# Patient Record
Sex: Female | Born: 1980 | Hispanic: Yes | Marital: Married | Smoking: Never smoker
Health system: Southern US, Community
[De-identification: ages and names within clinical notes are randomized; demographics above are authoritative.]

## PROBLEM LIST (undated history)

## (undated) DIAGNOSIS — E282 Polycystic ovarian syndrome: Secondary | ICD-10-CM

## (undated) DIAGNOSIS — E079 Disorder of thyroid, unspecified: Secondary | ICD-10-CM

## (undated) DIAGNOSIS — I10 Essential (primary) hypertension: Secondary | ICD-10-CM

## (undated) HISTORY — DX: Disorder of thyroid, unspecified: E07.9

## (undated) HISTORY — DX: Polycystic ovarian syndrome: E28.2

## (undated) HISTORY — DX: Essential (primary) hypertension: I10

---

## 2016-04-20 ENCOUNTER — Ambulatory Visit (INDEPENDENT_AMBULATORY_CARE_PROVIDER_SITE_OTHER): Payer: BLUE CROSS/BLUE SHIELD | Admitting: Obstetrics and Gynecology

## 2016-04-20 ENCOUNTER — Encounter: Payer: Self-pay | Admitting: Obstetrics and Gynecology

## 2016-04-20 VITALS — BP 167/118 | HR 88 | Ht 61.0 in | Wt 179.3 lb

## 2016-04-20 DIAGNOSIS — N938 Other specified abnormal uterine and vaginal bleeding: Secondary | ICD-10-CM

## 2016-04-20 DIAGNOSIS — E8881 Metabolic syndrome: Secondary | ICD-10-CM | POA: Diagnosis not present

## 2016-04-20 DIAGNOSIS — E282 Polycystic ovarian syndrome: Secondary | ICD-10-CM | POA: Diagnosis not present

## 2016-04-20 MED ORDER — DESOGESTREL-ETHINYL ESTRADIOL 0.15-0.02/0.01 MG (21/5) PO TABS: 1.0000 | ORAL_TABLET | Freq: Every day | ORAL | 1 refills | Status: DC

## 2016-04-20 NOTE — Progress Notes (Addendum)
HPI:      Elizabeth Potts is a 36 y.o. G0P0000 who LMP was Patient's last menstrual period was 02/25/2016 (approximate).  Subjective: She presents today to discuss her PCO. She has been previously diagnosed with this and she also has insulin resistance associated with PCO. She has been having anovulatory cycles for many years. She will go 3 months without bleeding and then have a three-week menses. She would like some resolution to this irregular bleeding. She has also experienced an increased weight gain in the last 2 years. In addition she has a concern regarding fertility. She still thinks that she may want children but is not sure of timing.    Hx: The following portions of the patient's history were reviewed and updated as appropriate:           She  has a past medical history of Hypertension and Thyroid disease. She  does not have a problem list on file. She  has no past surgical history on file. She  reports that she has never smoked. She has never used smokeless tobacco. She reports that she does not drink alcohol or use drugs.        ROS: Constitutional: Denied constitutional symptoms, night sweats, recent illness, fatigue, fever, insomnia and weight loss.  Eyes: Denied eye symptoms, eye pain, photophobia, vision change and visual disturbance.  Ears/Nose/Throat/Neck: Denied ear, nose, throat or neck symptoms, hearing loss, nasal discharge, sinus congestion and sore throat.  Cardiovascular: Denied cardiovascular symptoms, arrhythmia, chest pain/pressure, edema, exercise intolerance, orthopnea and palpitations.  Respiratory: Denied pulmonary symptoms, asthma, pleuritic pain, productive sputum, cough, dyspnea and wheezing.  Gastrointestinal: Denied, gastro-esophageal reflux, melena, nausea and vomiting.  Genitourinary: See HPI for additional information.  Musculoskeletal: Denied musculoskeletal symptoms, stiffness, swelling, muscle weakness and myalgia.  Dermatologic: Denied dermatology  symptoms, rash and scar.  Neurologic: Denied neurology symptoms, dizziness, headache, neck pain and syncope.  Psychiatric: Denied psychiatric symptoms, anxiety and depression.  Endocrine: Denied endocrine symptoms including hot flashes and night sweats.   Meds: She has a current medication list which includes the following prescription(s): amlodipine, labetalol, levothyroxine, metformin, and desogestrel-ethinyl estradiol.  Objective: Vitals:   04/20/16 0818  BP: (!) 167/118  Pulse: 88            Patient declined examination today because she is bleeding. Assessment: 1. PCOS (polycystic ovarian syndrome)   2. Insulin resistance   3. Dysfunctional uterine bleeding     She also has some fertility concerns and would like these addressed in the future. Plan:            1.  We have discussed PCO in detail. The dysfunctional bleeding and hormonal changes associated with PCO were discussed in detail. The infertility associated with PCO was also discussed.  2.  OCPs The risks /benefits of OCPs have been explained to the patient in detail.  Product literature has been given to her.  I have instructed her in the use of OCPs and have given her literature reinforcing this information.  I have explained to the patient that OCPs are not as effective for birth control during the first month of use, and that another form of contraception should be used during this time.  Both first-day start and Sunday start have been explained.  The risks and benefits of each was discussed.  She has been made aware of  the fact that other medications may affect the efficacy of OCPs.  I have answered all of her questions, and I  believe that she has an understanding of the effectiveness and use of OCPs. We have begun OCPs for treatment of PCO.  3. Continue metformin  4.  Return for annual examination in the near future to include Pap smear.  5.  After patient cycles regularly on OCPs she will inform us when she has reached  her decision regarding pregnancy.  Orders No orders of the defined types were placed in this encounter.    Meds ordered this encounter  Medications  . levothyroxine (SYNTHROID, LEVOTHROID) 100 MCG tablet    Sig: Take 100 mcg by mouth daily before breakfast.  . metFORMIN (GLUCOPHAGE) 500 MG tablet    Sig: Take 500 mg by mouth 2 (two) times daily with a meal.  . labetalol (NORMODYNE) 100 MG tablet    Sig: Take 100 mg by mouth 2 (two) times daily.  Marland Kitchen. amLODipine (NORVASC) 5 MG tablet    Sig: Take 5 mg by mouth daily.  Marland Kitchen. desogestrel-ethinyl estradiol (MIRCETTE) 0.15-0.02/0.01 MG (21/5) tablet    Sig: Take 1 tablet by mouth at bedtime.    Dispense:  3 Package    Refill:  1        F/U  Return in about 1 week (around 04/27/2016) for Annual Physical. I spent 25 minutes with this patient of which greater than 50% was spent discussing PCO both the hormonal and physical issues associated with it. We have discussed infertility. We have discussed treatments of PCO. (Please see above)  Elonda Huskyavid J. Latausha Flamm, M.D. 04/20/2016 8:59 AM

## 2016-04-20 NOTE — Progress Notes (Signed)
New 36 yo patient is here for prolonged periods. Has a history of PCOS x6 years. Is due for a pap smear- LPS 2014 with results being normal.

## 2016-05-04 ENCOUNTER — Encounter: Payer: Self-pay | Admitting: Obstetrics and Gynecology

## 2016-05-04 ENCOUNTER — Ambulatory Visit (INDEPENDENT_AMBULATORY_CARE_PROVIDER_SITE_OTHER): Payer: BLUE CROSS/BLUE SHIELD | Admitting: Obstetrics and Gynecology

## 2016-05-04 VITALS — BP 165/94 | HR 86 | Wt 181.2 lb

## 2016-05-04 DIAGNOSIS — E282 Polycystic ovarian syndrome: Secondary | ICD-10-CM | POA: Diagnosis not present

## 2016-05-04 DIAGNOSIS — E8881 Metabolic syndrome: Secondary | ICD-10-CM | POA: Diagnosis not present

## 2016-05-04 DIAGNOSIS — E039 Hypothyroidism, unspecified: Secondary | ICD-10-CM | POA: Diagnosis not present

## 2016-05-04 DIAGNOSIS — Z0000 Encounter for general adult medical examination without abnormal findings: Secondary | ICD-10-CM | POA: Diagnosis not present

## 2016-05-04 DIAGNOSIS — I10 Essential (primary) hypertension: Secondary | ICD-10-CM

## 2016-05-04 MED ORDER — DESOGESTREL-ETHINYL ESTRADIOL 0.15-0.02/0.01 MG (21/5) PO TABS: 1.0000 | ORAL_TABLET | Freq: Every day | ORAL | 3 refills | Status: DC

## 2016-05-04 NOTE — Progress Notes (Signed)
HPI:      Elizabeth Potts is a 36 y.o. G0P0000 who LMP was Patient's last menstrual period was 02/27/2016 (exact date).  Subjective: She presents today for her annual examination.  She has begun OCPs for PCO but she has not finished the pack yet. She has not yet decided on attempting fertility.    Hx: The following portions of the patient's history were reviewed and updated as appropriate:            She  has a past medical history of Hypertension and Thyroid disease. She  does not have any pertinent problems on file. She  has no past surgical history on file. Her family history includes Cancer in her father and mother; Diabetes in her father and mother; Hypertension in her father and mother. She  reports that she has never smoked. She has never used smokeless tobacco. She reports that she does not drink alcohol or use drugs. Current Outpatient Prescriptions on File Prior to Visit  Medication Sig Dispense Refill  . amLODipine (NORVASC) 5 MG tablet Take 5 mg by mouth daily.    Marland Kitchen. labetalol (NORMODYNE) 100 MG tablet Take 100 mg by mouth 2 (two) times daily.    Marland Kitchen. levothyroxine (SYNTHROID, LEVOTHROID) 100 MCG tablet Take 100 mcg by mouth daily before breakfast.    . metFORMIN (GLUCOPHAGE) 500 MG tablet Take 500 mg by mouth 2 (two) times daily with a meal.     No current facility-administered medications on file prior to visit.           ROS: Constitutional: Denied constitutional symptoms, night sweats, recent illness, fatigue, fever, insomnia and weight loss.  Eyes: Denied eye symptoms, eye pain, photophobia, vision change and visual disturbance.  Ears/Nose/Throat/Neck: Denied ear, nose, throat or neck symptoms, hearing loss, nasal discharge, sinus congestion and sore throat.  Cardiovascular: Denied cardiovascular symptoms, arrhythmia, chest pain/pressure, edema, exercise intolerance, orthopnea and palpitations.  Respiratory: Denied pulmonary symptoms, asthma, pleuritic pain, productive sputum,  cough, dyspnea and wheezing.  Gastrointestinal: Denied, gastro-esophageal reflux, melena, nausea and vomiting.  Genitourinary: Denied genitourinary symptoms including symptomatic vaginal discharge, pelvic relaxation issues, and urinary complaints.  Musculoskeletal: Denied musculoskeletal symptoms, stiffness, swelling, muscle weakness and myalgia.  Dermatologic: Denied dermatology symptoms, rash and scar.  Neurologic: Denied neurology symptoms, dizziness, headache, neck pain and syncope.  Psychiatric: Denied psychiatric symptoms, anxiety and depression.  Endocrine: Denied endocrine symptoms including hot flashes and night sweats.   Meds: She has a current medication list which includes the following prescription(s): amlodipine, desogestrel-ethinyl estradiol, labetalol, levothyroxine, and metformin.  Objective: Vitals:   05/04/16 1003  BP: (!) 165/94  Pulse: 86            Physical examination General NAD, Conversant  HEENT Atraumatic; Op clear with mmm.  Normo-cephalic. Pupils reactive. Anicteric sclerae  Thyroid/Neck Smooth without nodularity or enlargement. Normal ROM.  Neck Supple.  Skin No rashes, lesions or ulceration. Normal palpated skin turgor. No nodularity.  Breasts: No masses or discharge.  Symmetric.  No axillary adenopathy.  Lungs: Clear to auscultation.No rales or wheezes. Normal Respiratory effort, no retractions.  Heart: NSR.  No murmurs or rubs appreciated. No periferal edema  Abdomen: Soft.  Non-tender.  No masses.  No HSM. No hernia  Extremities: Moves all appropriately.  Normal ROM for age. No lymphadenopathy.  Neuro: Oriented to PPT.  Normal mood. Normal affect.     Pelvic:   Vulva: Normal appearance.  No lesions.  Vagina: No lesions or abnormalities noted.  Support:  Normal pelvic support.  Urethra No masses tenderness or scarring.  Meatus Normal size without lesions or prolapse.  Cervix: Normal appearance.  No lesions.  Anus: Normal exam.  No lesions.   Perineum: Normal exam.  No lesions.        Bimanual   Uterus: Normal size.  Non-tender.  Mobile.  AV.  Adnexae: No masses.  Non-tender to palpation.  Cul-de-sac: Negative for abnormality.     Assessment: 1. Encounter for annual physical exam   2. PCO (polycystic ovaries)   3. Acquired hypothyroidism   4. Insulin resistance   5. PCOS (polycystic ovarian syndrome)   6. Essential hypertension       Plan:            1.  Basic Screening Recommendations The basic screening recommendations for asymptomatic women were discussed with the patient during her visit.  The age-appropriate recommendations were discussed with her and the rational for the tests reviewed.  When I am informed by the patient that another primary care physician has previously obtained the age-appropriate tests and they are up-to-date, only outstanding tests are ordered and referrals given as necessary.  Abnormal results of tests will be discussed with her when all of her results are completed. Pap performed   2.  She will contact us if she changes her mind regarding immediate fertility.  3.  Continue OCPs   Meds ordered this encounter  Medications  . desogestrel-ethinyl estradiol (MIRCETTE) 0.15-0.02/0.01 MG (21/5) tablet    Sig: Take 1 tablet by mouth at bedtime.    Dispense:  3 Package    Refill:  3        F/U  Return in about 1 year (around 05/04/2017) for Annual Physical, We will contact her with any abnormal test results.  Elonda Husky, M.D. 05/04/2016 11:04 AM

## 2016-05-06 LAB — PAP IG AND HPV HIGH-RISK
HPV, high-risk: NEGATIVE
PAP Smear Comment: 0

## 2016-05-07 ENCOUNTER — Telehealth: Payer: Self-pay

## 2016-05-07 NOTE — Telephone Encounter (Signed)
Patient informed of negative test results per provider 

## 2016-05-07 NOTE — Telephone Encounter (Signed)
-----   Message from Linzie Collinavid James Evans, MD sent at 05/07/2016 10:56 AM EST ----- Negative Pap and HPV

## 2016-09-01 ENCOUNTER — Other Ambulatory Visit: Payer: Self-pay | Admitting: Family Medicine

## 2016-09-01 DIAGNOSIS — R1011 Right upper quadrant pain: Secondary | ICD-10-CM

## 2016-10-19 ENCOUNTER — Ambulatory Visit
Admission: RE | Admit: 2016-10-19 | Discharge: 2016-10-19 | Disposition: A | Discharge status: Home / Self Care | Payer: BLUE CROSS/BLUE SHIELD | Source: Ambulatory Visit | Attending: Family Medicine | Admitting: Family Medicine

## 2016-10-19 DIAGNOSIS — R1011 Right upper quadrant pain: Secondary | ICD-10-CM

## 2016-10-19 DIAGNOSIS — R932 Abnormal findings on diagnostic imaging of liver and biliary tract: Secondary | ICD-10-CM | POA: Insufficient documentation

## 2017-01-13 ENCOUNTER — Encounter: Payer: Self-pay | Admitting: Emergency Medicine

## 2017-01-13 ENCOUNTER — Emergency Department
Admission: EM | Admit: 2017-01-13 | Discharge: 2017-01-13 | Disposition: A | Discharge status: Home / Self Care | Payer: BLUE CROSS/BLUE SHIELD | Attending: Emergency Medicine | Admitting: Emergency Medicine

## 2017-01-13 DIAGNOSIS — R748 Abnormal levels of other serum enzymes: Secondary | ICD-10-CM | POA: Insufficient documentation

## 2017-01-13 DIAGNOSIS — R809 Proteinuria, unspecified: Secondary | ICD-10-CM | POA: Diagnosis not present

## 2017-01-13 DIAGNOSIS — B349 Viral infection, unspecified: Secondary | ICD-10-CM | POA: Diagnosis not present

## 2017-01-13 DIAGNOSIS — I10 Essential (primary) hypertension: Secondary | ICD-10-CM | POA: Insufficient documentation

## 2017-01-13 DIAGNOSIS — Z79899 Other long term (current) drug therapy: Secondary | ICD-10-CM | POA: Insufficient documentation

## 2017-01-13 DIAGNOSIS — Z7984 Long term (current) use of oral hypoglycemic drugs: Secondary | ICD-10-CM | POA: Diagnosis not present

## 2017-01-13 DIAGNOSIS — M791 Myalgia, unspecified site: Secondary | ICD-10-CM | POA: Diagnosis present

## 2017-01-13 LAB — URINALYSIS, COMPLETE (UACMP) WITH MICROSCOPIC
BILIRUBIN URINE: NEGATIVE
Glucose, UA: NEGATIVE mg/dL
KETONES UR: NEGATIVE mg/dL
LEUKOCYTES UA: NEGATIVE
Nitrite: NEGATIVE
Specific Gravity, Urine: 1.026 (ref 1.005–1.030)
pH: 5 (ref 5.0–8.0)

## 2017-01-13 LAB — COMPREHENSIVE METABOLIC PANEL
ALT: 102 U/L — AB (ref 14–54)
AST: 275 U/L — AB (ref 15–41)
Albumin: 3.5 g/dL (ref 3.5–5.0)
Alkaline Phosphatase: 60 U/L (ref 38–126)
Anion gap: 12 (ref 5–15)
BUN: 13 mg/dL (ref 6–20)
CHLORIDE: 102 mmol/L (ref 101–111)
CO2: 21 mmol/L — AB (ref 22–32)
CREATININE: 1.14 mg/dL — AB (ref 0.44–1.00)
Calcium: 8.7 mg/dL — ABNORMAL LOW (ref 8.9–10.3)
GFR calc Af Amer: >60 mL/min (ref >60)
GFR calc non Af Amer: >60 mL/min (ref >60)
Glucose, Bld: 120 mg/dL — ABNORMAL HIGH (ref 65–99)
Potassium: 3.6 mmol/L (ref 3.5–5.1)
SODIUM: 135 mmol/L (ref 135–145)
Total Bilirubin: 0.4 mg/dL (ref 0.3–1.2)
Total Protein: 7.3 g/dL (ref 6.5–8.1)

## 2017-01-13 LAB — CBC
HCT: 41.3 % (ref 35.0–47.0)
Hemoglobin: 14 g/dL (ref 12.0–16.0)
MCH: 26.7 pg (ref 26.0–34.0)
MCHC: 33.9 g/dL (ref 32.0–36.0)
MCV: 78.8 fL — ABNORMAL LOW (ref 80.0–100.0)
PLATELETS: 246 10*3/uL (ref 150–440)
RBC: 5.24 MIL/uL — AB (ref 3.80–5.20)
RDW: 14.6 % — ABNORMAL HIGH (ref 11.5–14.5)
WBC: 7.6 10*3/uL (ref 3.6–11.0)

## 2017-01-13 LAB — POCT PREGNANCY, URINE: Preg Test, Ur: NEGATIVE

## 2017-01-13 LAB — LIPASE, BLOOD: LIPASE: 25 U/L (ref 11–51)

## 2017-01-13 LAB — TSH: TSH: 4.681 u[IU]/mL — ABNORMAL HIGH (ref 0.350–4.500)

## 2017-01-13 MED ORDER — CYCLOBENZAPRINE HCL 5 MG PO TABS: 5.0000 mg | ORAL_TABLET | Freq: Three times a day (TID) | ORAL | 0 refills | Status: AC | PRN

## 2017-01-13 MED ORDER — SODIUM CHLORIDE 0.9 % IV BOLUS (SEPSIS)
1000.0000 mL | Freq: Once | INTRAVENOUS | Status: AC
  Administered 2017-01-13: 1000 mL via INTRAVENOUS

## 2017-01-13 NOTE — ED Triage Notes (Signed)
Pt reports generalized body aches for four days. Pt states vomited once two days ago but denies any vomiting since. Pt denies diarrhea, denies cough or nasal congestion. Pt ambulatory to triage. No apparent distress noted. Pt reports took BP medication twenty minutes ago.

## 2017-01-13 NOTE — ED Provider Notes (Signed)
[facility] Emergency Department Provider Note  ____________________________________________  Time seen: Approximately 10:59 AM  I have reviewed the triage vital signs and the nursing notes.   HISTORY  Chief Complaint Generalized body aches    HPI Tawanna SatDary Sondra ComeCruz is a 36 y.o. female that presents to the emergency department for body aches for 2 days. She vomited once yesterday. Pain started in her shoulders and arms. Pain today is primarily in her shoulders and in her legs. She has been taking Advil for body aches. No Tylenol use.  Patient does not drink alcohol.  Partner was sick earlier this week with similar symptoms. Her primary care provider increased her Synthroid this week. She was taking 150 mg and is now taking 175 mg. Patient is from [place] and moved here 4 days ago. She believes she received all her vaccinations as a child. Her menstrual cycle was last week but she missed a dose of her birth control and had some spotting. She had an ultrasound completed 2 months ago for abdominal cramping at that time. She denies fever, headache, visual changes, SOB, CP, nausea, abdominal pain, back pain, hematuria, dysuria, urgency, frequency.    Past Medical History:  Diagnosis Date  . Hypertension   . Thyroid disease     Patient Active Problem List   Diagnosis Date Noted  . PCO (polycystic ovaries) 05/04/2016  . Acquired hypothyroidism 05/04/2016  . Insulin resistance 05/04/2016  . Essential hypertension 05/04/2016    No past surgical history on file.  Prior to Admission medications   Medication Sig Start Date End Date Taking? Authorizing Provider  amLODipine (NORVASC) 5 MG tablet Take 5 mg by mouth daily.    [provider]  cyclobenzaprine (FLEXERIL) 5 MG tablet Take 1 tablet (5 mg total) by mouth 3 (three) times daily as needed for muscle spasms. 01/13/17 01/20/17  Enid DerryWagner, Tashunda Vandezande, PA-C  desogestrel-ethinyl estradiol (MIRCETTE) 0.15-0.02/0.01 MG (21/5)  tablet Take 1 tablet by mouth at bedtime. 05/04/16   Linzie CollinEvans, David James, MD  labetalol (NORMODYNE) 100 MG tablet Take 100 mg by mouth 2 (two) times daily.    [provider]  levothyroxine (SYNTHROID, LEVOTHROID) 100 MCG tablet Take 100 mcg by mouth daily before breakfast.    [provider]  metFORMIN (GLUCOPHAGE) 500 MG tablet Take 500 mg by mouth 2 (two) times daily with a meal.    [provider]    Allergies Patient has no known allergies.  Family History  Problem Relation Age of Onset  . Cancer Mother   . Diabetes Mother   . Hypertension Mother   . Cancer Father   . Diabetes Father   . Hypertension Father     Social History Social History  Substance Use Topics  . Smoking status: Never Smoker  . Smokeless tobacco: Never Used  . Alcohol use No     Review of Systems  Constitutional: No fever/chills Cardiovascular: No chest pain. Respiratory: No SOB. Gastrointestinal: No abdominal pain.  No nausea.  Genitourinary: Negative for dysuria. Skin: Negative for rash, abrasions, lacerations, ecchymosis. Neurological: Negative for headaches, numbness or tingling   ____________________________________________   PHYSICAL EXAM:  VITAL SIGNS: ED Triage Vitals  Enc Vitals Group     BP 01/13/17 1025 (!) 169/108     Pulse Rate 01/13/17 1025 (!) 118     Resp 01/13/17 1025 20     Temp 01/13/17 1025 99.5 F (37.5 C)     Temp Source 01/13/17 1025 Oral     SpO2  01/13/17 1025 98 %     Weight 01/13/17 1026 170 lb (77.1 kg)     Height 01/13/17 1026 5\' 1"  (1.549 m)     Head Circumference --      Peak Flow --      Pain Score 01/13/17 1025 6     Pain Loc --      Pain Edu? --      Excl. in GC? --      Constitutional: Alert and oriented. Well appearing and in no acute distress. Eyes: Conjunctivae are normal. PERRL. EOMI. Head: Atraumatic. ENT:      Ears:      Nose: No congestion/rhinnorhea.      Mouth/Throat: Mucous membranes are moist.  Neck: No  stridor.   Cardiovascular: Normal rate, regular rhythm.  Good peripheral circulation. Respiratory: Normal respiratory effort without tachypnea or retractions. Lungs CTAB. Good air entry to the bases with no decreased or absent breath sounds. Gastrointestinal: Bowel sounds 4 quadrants. Soft and nontender to palpation. No guarding or rigidity. No palpable masses. No distention. No CVA tenderness. Musculoskeletal: Full range of motion to all extremities. No gross deformities appreciated. Neurologic:  Normal speech and language. No gross focal neurologic deficits are appreciated.  Skin:  Skin is warm, dry and intact. No rash noted. Psychiatric: Mood and affect are normal. Speech and behavior are normal. Patient exhibits appropriate insight and judgement.   ____________________________________________   LABS (all labs ordered are listed, but only abnormal results are displayed)  Labs Reviewed  URINALYSIS, COMPLETE (UACMP) WITH MICROSCOPIC - Abnormal; Notable for the following:       Result Value   Color, Urine YELLOW (*)    APPearance HAZY (*)    Hgb urine dipstick LARGE (*)    Protein, ur >=300 (*)    Bacteria, UA RARE (*)    Squamous Epithelial / LPF 0-5 (*)    All other components within normal limits  CBC - Abnormal; Notable for the following:    RBC 5.24 (*)    MCV 78.8 (*)    RDW 14.6 (*)    All other components within normal limits  COMPREHENSIVE METABOLIC PANEL - Abnormal; Notable for the following:    CO2 21 (*)    Glucose, Bld 120 (*)    Creatinine, Ser 1.14 (*)    Calcium 8.7 (*)    AST 275 (*)    ALT 102 (*)    All other components within normal limits  TSH - Abnormal; Notable for the following:    TSH 4.681 (*)    All other components within normal limits  URINE CULTURE  LIPASE, BLOOD  HEPATITIS PANEL, ACUTE  HEPATITIS B VIRUS (PROFILE VI)  HEPATITIS A ANTIBODY, TOTAL  POC URINE PREG, ED  POCT PREGNANCY, URINE    ____________________________________________  EKG   ____________________________________________  RADIOLOGY   No results found.  ____________________________________________    PROCEDURES  Procedure(s) performed:    Procedures    Medications  sodium chloride 0.9 % bolus 1,000 mL (0 mLs Intravenous Stopped 01/13/17 1212)     ____________________________________________   INITIAL IMPRESSION / ASSESSMENT AND PLAN / ED COURSE  Pertinent labs & imaging results that were available during my care of the patient were reviewed by me and considered in my medical decision making (see chart for details).  Review of the [place] CSRS was performed in accordance of the [facility] prior to dispensing any controlled drugs.     Patient presented to the emergency room for evaluation  of muscle aches for 2 days. Vital signs and exam are reassuring. Patient appears well. This is likely viral, as partner has similar symptoms. Blood work was drawn to evaluate electrolytes and WBC. CMP remarkable for AST of 275 and ALT of 102. Patient had an ultrasound recently, which showed a fatty liver. Hepatitis panel is in progress. Urinalysis shows proteinuria. She has both high blood pressure and diabetes. She has not seen any blood in her urine. She took her BP medication upon arriving to the ED. She is not having any nausea, vomiting, abdominal pain. We discussed imaging but that these findings are likely chronic and not related to her muscle aches. She has good follow up with primary care and agreed to call PCP in the morning.  Patient will be discharged home with prescriptions for flexeril. Patient is to follow up with PCP for repeat blood work, urinalysis, and to discuss blood pressure medication. Patient is given ED precautions to return to the ED for any worsening or new symptoms.     ____________________________________________  FINAL CLINICAL IMPRESSION(S) / ED DIAGNOSES  Final diagnoses:   Proteinuria, unspecified type  Elevated liver enzymes  Viral illness      NEW MEDICATIONS STARTED DURING THIS VISIT:  Discharge Medication List as of 01/13/2017  2:43 PM    START taking these medications   Details  cyclobenzaprine (FLEXERIL) 5 MG tablet Take 1 tablet (5 mg total) by mouth 3 (three) times daily as needed for muscle spasms., Starting Thu 01/13/2017, Until Thu 01/20/2017, Print            This chart was dictated using voice recognition software/Dragon. Despite best efforts to proofread, errors can occur which can change the meaning. Any change was purely unintentional.    Enid Derry, PA-C 01/13/17 1851    Phineas Semen, MD 01/14/17 1525

## 2017-01-13 NOTE — ED Notes (Addendum)
Pt c/o generalized body aches for 2 days. Pt states she vomited once yesterday as well. Pt states she has sinus pain. Pt mostly c/o pain in shoulders, lower back and legs. Pt states she has been eating and drinking normally. Pt states she took ibuprofen and excedrin yesterday.

## 2017-01-13 NOTE — Discharge Instructions (Addendum)
Please follow up with primary care as soon as possible for repeat blood work and urinalysis. Hepatitis panel is still in process.

## 2017-01-14 LAB — HEPATITIS PANEL, ACUTE
HCV Ab: <0.1 s/co ratio (ref 0.0–0.9)
Hep A IgM: NEGATIVE
Hep B C IgM: NEGATIVE
Hepatitis B Surface Ag: NEGATIVE

## 2017-01-14 LAB — HEPATITIS A ANTIBODY, TOTAL: Hep A Total Ab: NEGATIVE

## 2017-01-14 LAB — HEPATITIS B VIRUS (PROFILE VI)
HEP B C TOTAL AB: NEGATIVE
HEP B E AB: NEGATIVE
HEP B E AG: NEGATIVE
HEP B S AB: NONREACTIVE
HEP B S AG: NEGATIVE
Hep B C IgM: NEGATIVE

## 2017-01-15 LAB — URINE CULTURE

## 2018-07-09 IMAGING — US US ABDOMEN COMPLETE
1 series · 13 of 25 positions shown · non-contrast
Comparison: None.

CLINICAL DATA: Right upper quadrant abdominal pain, worse with
food, intermittent nausea and vomiting over the last 2 months

EXAM:
ABDOMEN ULTRASOUND COMPLETE

[Series 1: us abdomen complete · 0.25mm/px · 13 of 108 slices shown]
[im 1/108]
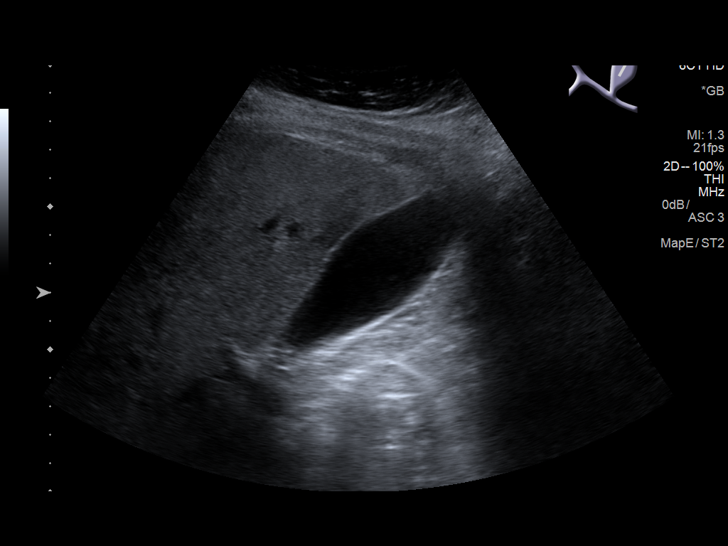
[im 9/108]
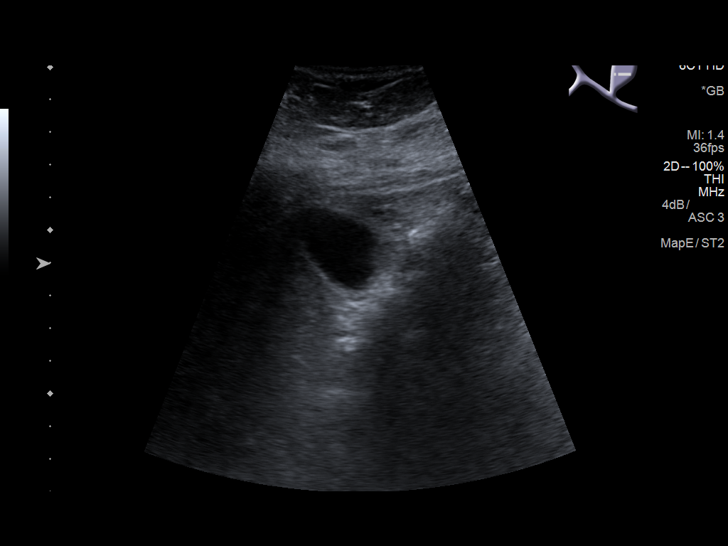
[im 18/108]
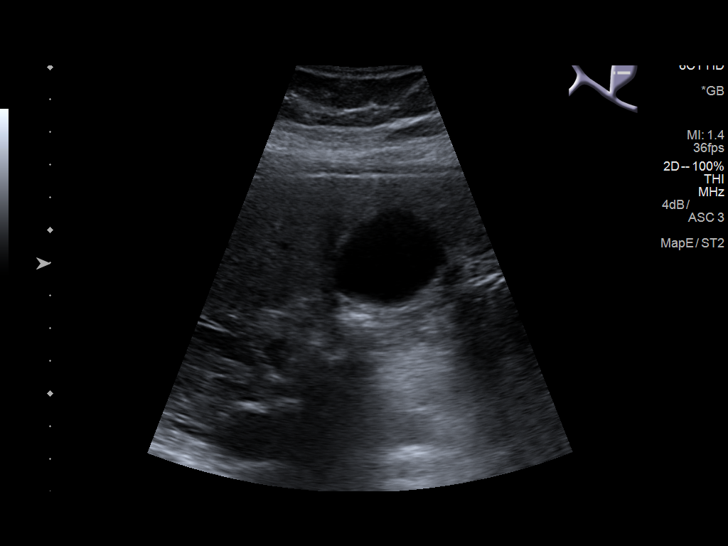
[im 27/108]
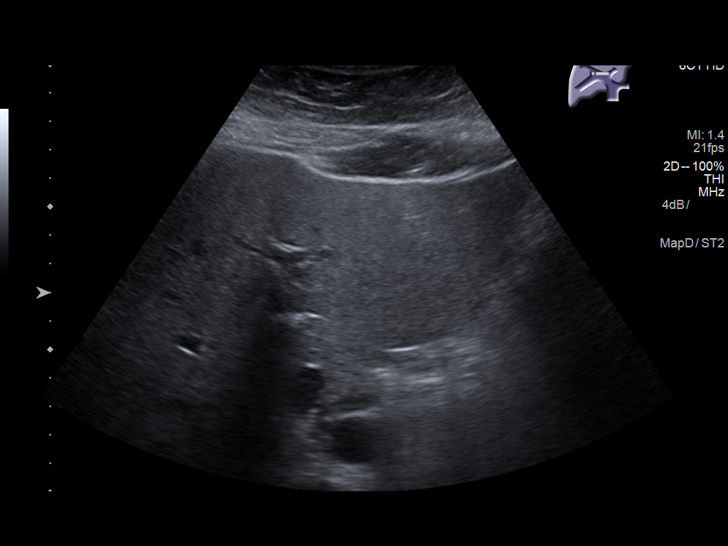
[im 36/108]
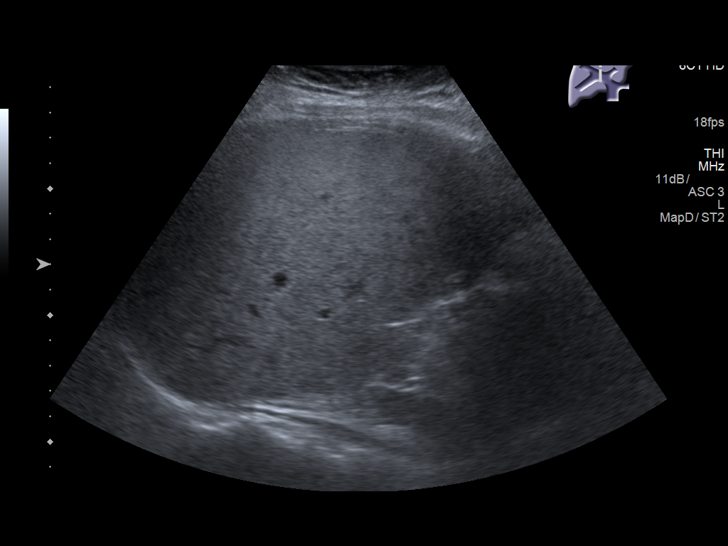
[im 45/108]
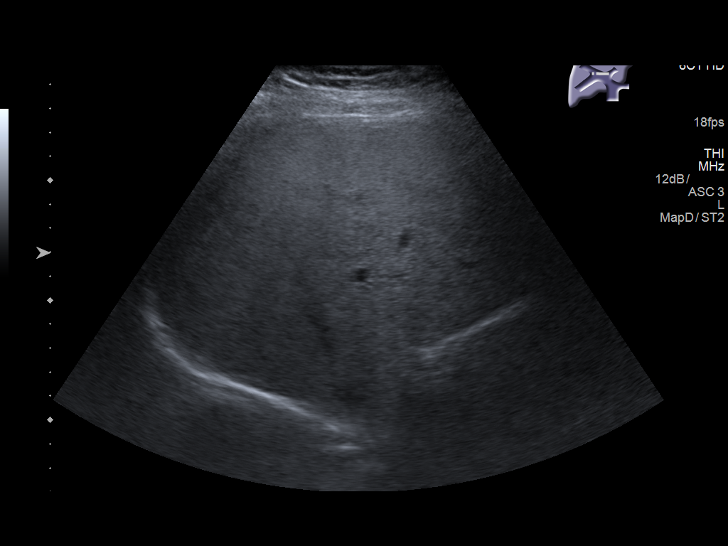
[im 54/108]
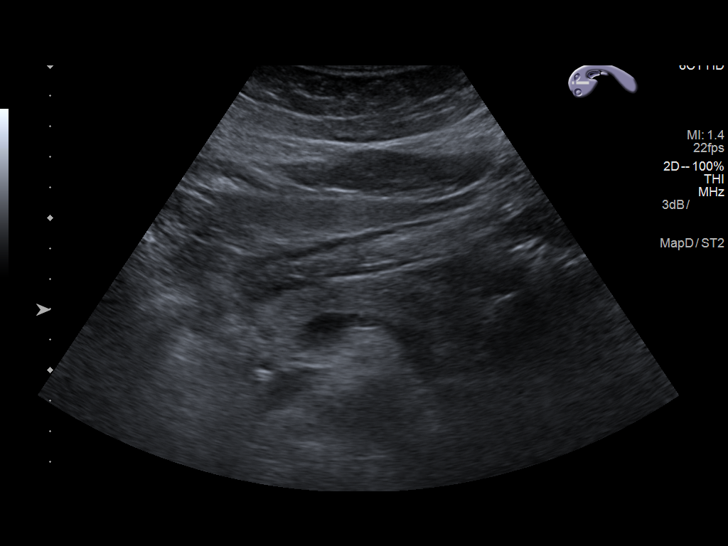
[im 63/108]
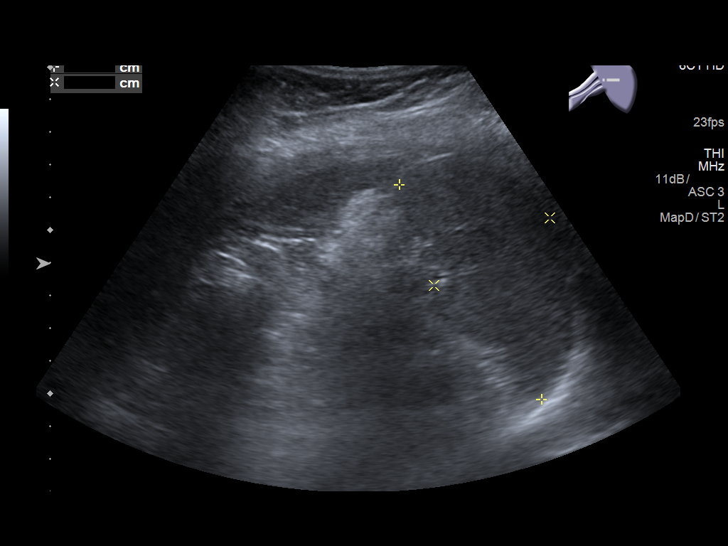
[im 72/108]
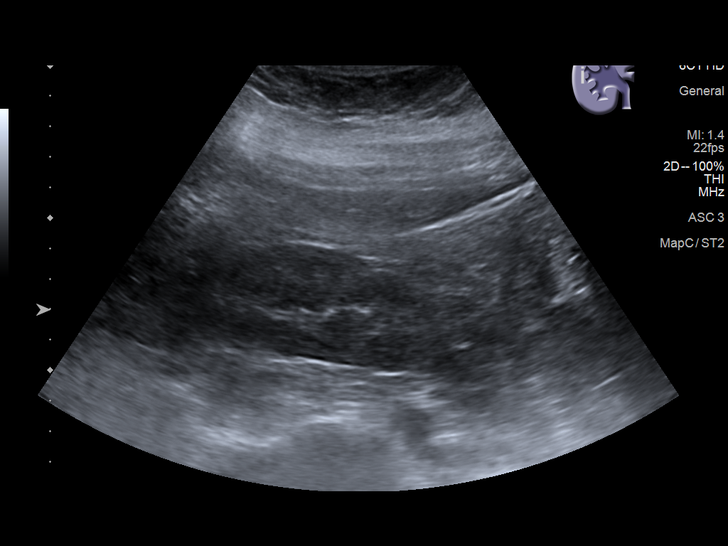
[im 81/108]
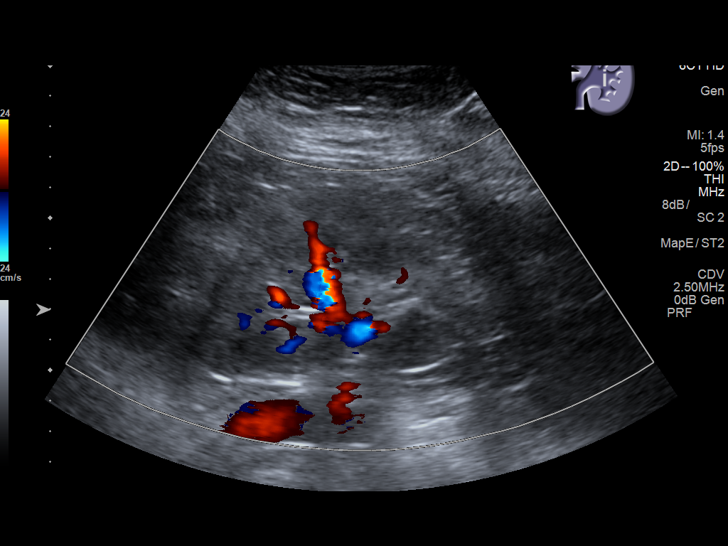
[im 90/108]
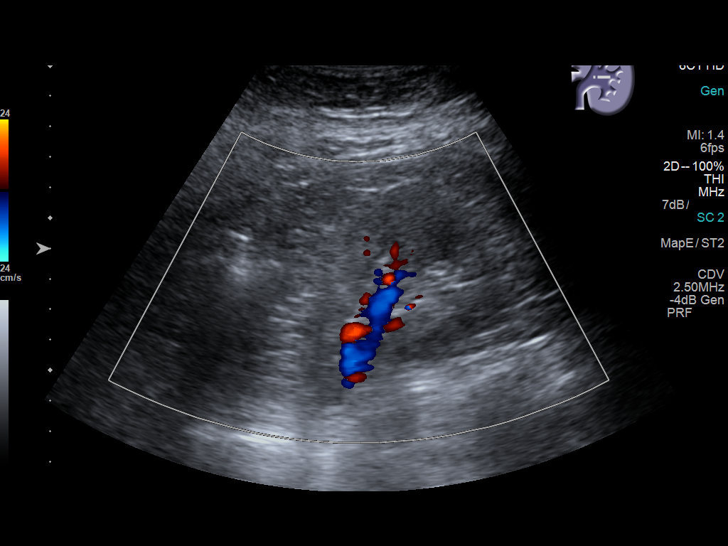
[im 99/108]
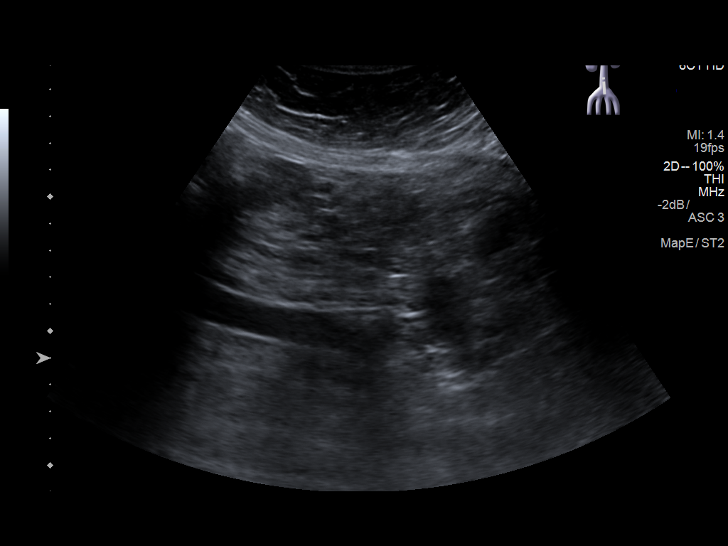
[im 108/108]
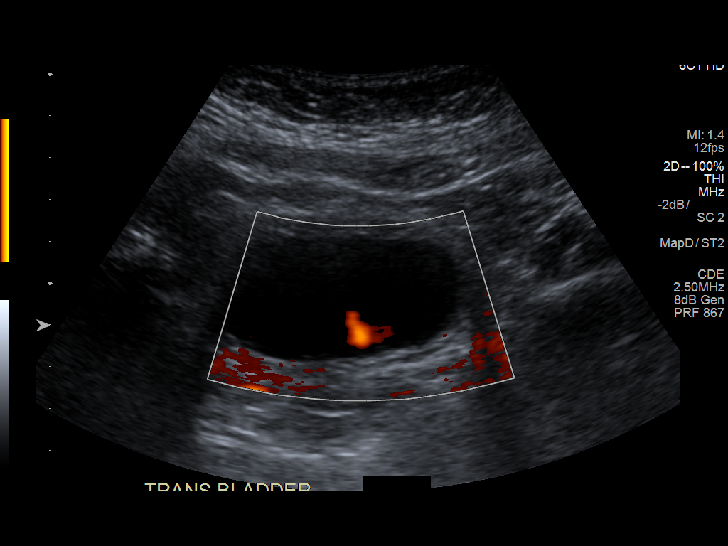

[13 of 25 positions shown; findings below may reference images not displayed]

FINDINGS: Gallbladder: The gallbladder is visualized and no gallstones are
noted. There is a small focus of echogenicity within the anterior
gallbladder wall with comet tail artifact, most consistent with a
focus of adenomyomatosis. There is no pain over the gallbladder with
compression.

Common bile duct: Diameter: The common bile duct is normal measuring
3.4 mm in diameter.

Liver: The liver parenchyma is echogenic and inhomogeneous
consistent with diffuse fatty infiltration. No focal hepatic
abnormality is seen.

IVC: No abnormality visualized.

Pancreas: The head and body the pancreas is relatively well
visualized. Only the most distal tail is obscured by bowel gas.

Spleen: The spleen measures 5.2 cm.

Right Kidney: Length: 9.8 cm..  No hydronephrosis is seen.

Left Kidney: Length: 10.6 cm.. There is minimal fullness of the left
pelvocaliceal system of questionable significance. No echogenic
calculi are evident. The echogenicity of the left kidney may be
somewhat increased.

Abdominal aorta: The abdominal aorta is normal in caliber.

Other findings: None.
IMPRESSION: 1. No gallstones, but there does appear to be a focus of
adenomyomatosis within the gallbladder wall. No ductal dilatation.
2. Echogenic liver parenchyma consistent with fatty infiltration. No
focal hepatic abnormality.
3. Mild fullness of the left pelvocaliceal system of questionable
significance. Also the left renal parenchyma may be slightly
echogenic.

## 2018-12-05 ENCOUNTER — Ambulatory Visit: Payer: BLUE CROSS/BLUE SHIELD | Admitting: General Surgery

## 2018-12-21 ENCOUNTER — Encounter: Payer: Self-pay | Admitting: General Surgery

## 2018-12-21 ENCOUNTER — Other Ambulatory Visit: Payer: Self-pay

## 2018-12-21 ENCOUNTER — Ambulatory Visit (INDEPENDENT_AMBULATORY_CARE_PROVIDER_SITE_OTHER): Payer: BLUE CROSS/BLUE SHIELD | Admitting: General Surgery

## 2018-12-21 VITALS — BP 154/105 | HR 78 | Temp 97.7°F | Resp 12 | Ht 60.0 in | Wt 182.0 lb

## 2018-12-21 DIAGNOSIS — R1011 Right upper quadrant pain: Secondary | ICD-10-CM | POA: Diagnosis not present

## 2018-12-21 NOTE — Patient Instructions (Addendum)
We will schedule you for an Ultrasound of the Gallbladder.   This is scheduled at the [facility] on Thursday October 15th at 10:15 am. Elizabeth Potts will need to arrive there by 10:00 am. You will need to have nothing eat or drink after midnight the night prior.  We will call you once we get these results.    Gallbladder Eating Plan If you have a gallbladder condition, you may have trouble digesting fats. Eating a low-fat diet can help reduce your symptoms, and may be helpful before and after having surgery to remove your gallbladder (cholecystectomy). Your health care provider may recommend that you work with a diet and nutrition specialist (dietitian) to help you reduce the amount of fat in your diet. What are tips for following this plan? General guidelines  Limit your fat intake to less than 30% of your total daily calories. If you eat around 1,800 calories each day, this is less than 60 grams (g) of fat per day.  Fat is an important part of a healthy diet. Eating a low-fat diet can make it hard to maintain a healthy body weight. Ask your dietitian how much fat, calories, and other nutrients you need each day.  Eat small, frequent meals throughout the day instead of three large meals.  Drink at least 8-10 cups of fluid a day. Drink enough fluid to keep your urine clear or pale yellow.  Limit alcohol intake to no more than 1 drink a day for nonpregnant women and 2 drinks a day for men. One drink equals 12 oz of beer, 5 oz of wine, or 1 oz of hard liquor. Reading food labels  Check Nutrition Facts on food labels for the amount of fat per serving. Choose foods with less than 3 grams of fat per serving. Shopping  Choose nonfat and low-fat healthy foods. Look for the words "nonfat," "low fat," or "fat free."  Avoid buying processed or prepackaged foods. Cooking  Cook using low-fat methods, such as baking, broiling, grilling, or boiling.  Cook with small amounts of  healthy fats, such as olive oil, grapeseed oil, canola oil, or sunflower oil. What foods are recommended?   All fresh, frozen, or canned fruits and vegetables.  Whole grains.  Low-fat or non-fat (skim) milk and yogurt.  Lean meat, skinless poultry, fish, eggs, and beans.  Low-fat protein supplement powders or drinks.  Spices and herbs. What foods are not recommended?  High-fat foods. These include baked goods, fast food, fatty cuts of meat, ice cream, french toast, sweet rolls, pizza, cheese bread, foods covered with butter, creamy sauces, or cheese.  Fried foods. These include french fries, tempura, battered fish, breaded chicken, fried breads, and sweets.  Foods with strong odors.  Foods that cause bloating and gas. Summary  A low-fat diet can be helpful if you have a gallbladder condition, or before and after gallbladder surgery.  Limit your fat intake to less than 30% of your total daily calories. This is about 60 g of fat if you eat 1,800 calories each day.  Eat small, frequent meals throughout the day instead of three large meals. This information is not intended to replace advice given to you by your health care provider. Make sure you discuss any questions you have with your health care provider. Document Released: 03/06/2013 Document Revised: 06/22/2018 Document Reviewed: 04/08/2016 Elsevier Patient Education  2020 [employer].

## 2018-12-21 NOTE — Progress Notes (Signed)
Patient ID: Elizabeth Potts, female   DOB: 19-Jun-1980, 38 y.o.   MRN: [id number]  Chief Complaint  Patient presents with  . New Patient (Initial Visit)    gallbladder    HPI Elizabeth Potts is a 38 y.o. female.  * She was referred by her primary care provider for further evaluation of right upper quadrant pain.  [name] states that starting in 2018, she had intermittent right upper quadrant pain.  This was evaluated with an ultrasound.  Adenomyomatosis was appreciated within the gallbladder, but no stones, sludge, gallbladder wall thickening, pericholecystic fluid, or biliary dilatation was identified.  In November 2019, she reports that she began having more frequent episodes, and that the pain was worse when she had them.  She states that the pain would wrap around from her right upper quadrant to her back.  Currently, the pain still occurs on a fairly regular basis.  It is worse when eating fatty foods.  She does endorse some nausea in the mornings when she wakes up, but has not experienced any vomiting.  She endorses constipation.  She has never had acholic stools.  She has not had any fevers or chills.  She denies jaundice or any history of pancreatitis.  She would like to avoid surgery if possible.   Past Medical History:  Diagnosis Date  . Hypertension   . PCOS (polycystic ovarian syndrome)   . Thyroid disease     History reviewed. No pertinent surgical history.  Family History  Problem Relation Age of Onset  . Cancer Mother   . Diabetes Mother   . Hypertension Mother   . Cancer Father   . Diabetes Father   . Hypertension Father   . Thyroid disease Father     Social History Social History   Tobacco Use  . Smoking status: Never Smoker  . Smokeless tobacco: Never Used  Substance Use Topics  . Alcohol use: No  . Drug use: No    No Known Allergies  Current Outpatient Medications  Medication Sig Dispense Refill  . amLODipine (NORVASC) 5 MG tablet Take 5 mg by mouth daily.    Marland Kitchen  labetalol (NORMODYNE) 100 MG tablet Take 100 mg by mouth 2 (two) times daily.    Marland Kitchen levothyroxine (SYNTHROID, LEVOTHROID) 100 MCG tablet Take 100 mcg by mouth daily before breakfast.    . metFORMIN (GLUCOPHAGE) 500 MG tablet Take 500 mg by mouth 2 (two) times daily with a meal.     No current facility-administered medications for this visit.     Review of Systems Review of Systems  All other systems reviewed and are negative. Or as discussed above.  Blood pressure (!) 154/105, pulse 78, temperature 97.7 F (36.5 C), temperature source Temporal, resp. rate 12, height 5' (1.524 m), weight 182 lb (82.6 kg), SpO2 99 %. Body mass index is 35.54 kg/m.  Physical Exam Physical Exam Constitutional:      General: She is not in acute distress.    Appearance: Normal appearance. She is obese.  HENT:     Head: Normocephalic and atraumatic.     Nose:     Comments: Covered with a mask secondary to COVID-19 precautions    Mouth/Throat:     Comments: Covered with a mask secondary to COVID-19 precautions Eyes:     General: No scleral icterus.       Right eye: No discharge.        Left eye: No discharge.  Neck:     Musculoskeletal: Normal  range of motion. No neck rigidity.     Comments: Thyromegaly is present.  The gland moves freely with deglutition. Cardiovascular:     Rate and Rhythm: Normal rate and regular rhythm.     Pulses: Normal pulses.  Pulmonary:     Effort: Pulmonary effort is normal.     Breath sounds: Normal breath sounds.  Abdominal:     General: Abdomen is flat. There is no distension.     Palpations: Abdomen is soft.     Tenderness: There is no abdominal tenderness.  Genitourinary:    Comments: Deferred Musculoskeletal:        General: No swelling or deformity.  Lymphadenopathy:     Cervical: No cervical adenopathy.  Skin:    General: Skin is warm and dry.  Neurological:     General: No focal deficit present.     Mental Status: She is alert and oriented to person,  place, and time.  Psychiatric:        Mood and Affect: Mood normal.        Behavior: Behavior normal.     Data Reviewed I reviewed the paper copies of her outside labs, provided by the primary care provider.  Among the findings, her white blood cell count is mildly elevated at 11.3.  She is slightly anemic based upon her hemoglobin and hematocrit.  It appears to be iron deficiency based upon her iron studies.  Her TSH is slightly over suppressed but her free T3 and free T4 are within normal range.  No liver function tests were included in the outside clinic data. I also reviewed the ultrasound that was performed in 2018.  Those findings were discussed above, in the history of present illness.  I am in agreement with the radiologist findings.  Assessment This is a 38 year old woman who has intermittent right upper quadrant pain.  Her symptoms are consistent with biliary colic, though she did not have any stones described on her previous ultrasound.  She is eager to avoid surgery, if at all possible.  I cautioned her, that her symptoms seem to be escalating and it may be unavoidable.  Plan We will first obtain a repeat right upper quadrant ultrasound to determine if there has been any significant change in the 2-year interval.  Once I have those results, I will contact her.  We discussed that certainly, she may choose to continue with observation and wait until the symptoms become severe enough that she would prefer surgery.  The increase in severity and frequency of her symptoms does concern me, and I do think she may be better off having an operation.  Nonetheless, it is her preference that we avoid it if at all possible.  I will await the results of her ultrasound and we will go from there.    Duanne Guess 12/21/2018, 12:55 PM

## 2018-12-28 ENCOUNTER — Other Ambulatory Visit: Payer: Self-pay

## 2018-12-28 ENCOUNTER — Ambulatory Visit
Admission: RE | Admit: 2018-12-28 | Discharge: 2018-12-28 | Disposition: A | Discharge status: Home / Self Care | Payer: BLUE CROSS/BLUE SHIELD | Source: Ambulatory Visit | Attending: General Surgery | Admitting: General Surgery

## 2018-12-28 DIAGNOSIS — R1011 Right upper quadrant pain: Secondary | ICD-10-CM | POA: Insufficient documentation

## 2018-12-29 ENCOUNTER — Telehealth: Payer: Self-pay | Admitting: General Surgery

## 2018-12-29 ENCOUNTER — Encounter: Payer: Self-pay | Admitting: General Surgery

## 2018-12-29 NOTE — Telephone Encounter (Signed)
Called patient to relay results of ultrasound imaging.  No answer.  Left message for her to return my call.  I will also send a MyChart message.

## 2019-08-04 ENCOUNTER — Ambulatory Visit: Payer: Self-pay | Attending: Internal Medicine

## 2019-08-04 ENCOUNTER — Other Ambulatory Visit: Payer: Self-pay

## 2019-08-04 DIAGNOSIS — Z23 Encounter for immunization: Secondary | ICD-10-CM

## 2019-08-04 NOTE — Progress Notes (Signed)
   Covid-19 Vaccination Clinic  Name:  Shatonya Passon    MRN: [id number] DOB: 1980-08-23  08/04/2019  Ms. Raybourn was observed post Covid-19 immunization for 15 minutes without incident. She was provided with Vaccine Information Sheet and instruction to access the V-Safe system.   Ms. Haning was instructed to call 911 with any severe reactions post vaccine: Marland Kitchen Difficulty breathing  . Swelling of face and throat  . A fast heartbeat  . A bad rash all over body  . Dizziness and weakness   Immunizations Administered    Name Date Dose VIS Date Route   Pfizer COVID-19 Vaccine 08/04/2019 10:35 AM 0.3 mL 05/09/2018 Intramuscular   Manufacturer: [employer], [employer]   Lot: [id number]   NDC: [address]-1

## 2019-08-25 ENCOUNTER — Ambulatory Visit: Payer: Self-pay | Attending: Internal Medicine

## 2019-08-25 DIAGNOSIS — Z23 Encounter for immunization: Secondary | ICD-10-CM

## 2019-08-25 NOTE — Progress Notes (Signed)
   Covid-19 Vaccination Clinic  Name:  Elizabeth Potts    MRN: [id number] DOB: 02-Nov-1980  08/25/2019  Elizabeth Potts was observed post Covid-19 immunization for 15 minutes without incident. She was provided with Vaccine Information Sheet and instruction to access the V-Safe system.   Elizabeth Potts was instructed to call 911 with any severe reactions post vaccine: Marland Kitchen Difficulty breathing  . Swelling of face and throat  . A fast heartbeat  . A bad rash all over body  . Dizziness and weakness   Immunizations Administered    Name Date Dose VIS Date Route   Pfizer COVID-19 Vaccine 08/25/2019 11:09 AM 0.3 mL 05/09/2018 Intramuscular   Manufacturer: [employer], [employer]   Lot: [id number]   NDC: [address]-0

## 2019-10-29 DIAGNOSIS — R030 Elevated blood-pressure reading, without diagnosis of hypertension: Secondary | ICD-10-CM | POA: Diagnosis not present

## 2019-10-29 DIAGNOSIS — J019 Acute sinusitis, unspecified: Secondary | ICD-10-CM | POA: Diagnosis not present

## 2019-10-29 DIAGNOSIS — Z03818 Encounter for observation for suspected exposure to other biological agents ruled out: Secondary | ICD-10-CM | POA: Diagnosis not present

## 2019-10-29 DIAGNOSIS — R509 Fever, unspecified: Secondary | ICD-10-CM | POA: Diagnosis not present

## 2019-10-29 DIAGNOSIS — J209 Acute bronchitis, unspecified: Secondary | ICD-10-CM | POA: Diagnosis not present

## 2020-07-24 DIAGNOSIS — E669 Obesity, unspecified: Secondary | ICD-10-CM | POA: Diagnosis not present

## 2020-07-24 DIAGNOSIS — E039 Hypothyroidism, unspecified: Secondary | ICD-10-CM | POA: Diagnosis not present

## 2020-07-24 DIAGNOSIS — Z1159 Encounter for screening for other viral diseases: Secondary | ICD-10-CM | POA: Diagnosis not present

## 2020-07-24 DIAGNOSIS — Z1389 Encounter for screening for other disorder: Secondary | ICD-10-CM | POA: Diagnosis not present

## 2020-07-24 DIAGNOSIS — E282 Polycystic ovarian syndrome: Secondary | ICD-10-CM | POA: Diagnosis not present

## 2020-07-24 DIAGNOSIS — Z131 Encounter for screening for diabetes mellitus: Secondary | ICD-10-CM | POA: Diagnosis not present

## 2020-07-24 DIAGNOSIS — I10 Essential (primary) hypertension: Secondary | ICD-10-CM | POA: Diagnosis not present

## 2020-10-26 IMAGING — US US ABDOMEN LIMITED
1 series · 14 of 25 positions shown · non-contrast
Comparison: Abdomen ultrasound, 10/19/2016.

CLINICAL DATA: Right upper quadrant abdominal pain.

EXAM:
ULTRASOUND ABDOMEN LIMITED RIGHT UPPER QUADRANT

[Series 1: us abdomen limited · 0.28mm/px · 14 of 60 slices shown]
[im 1/60]
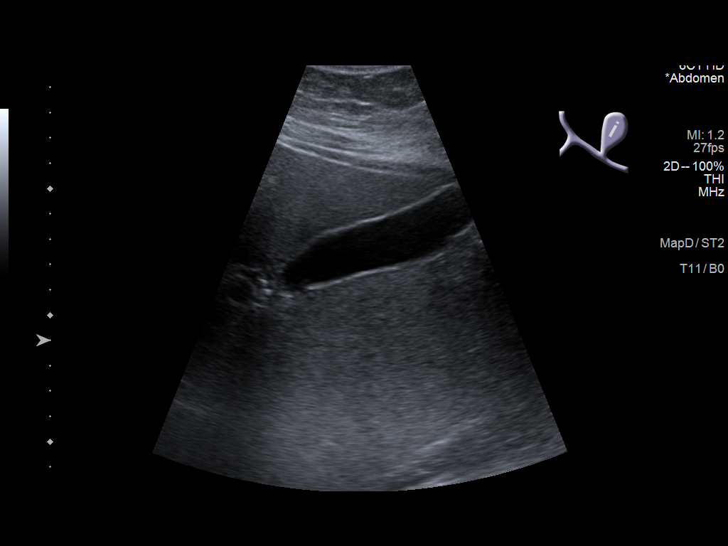
[im 5/60]
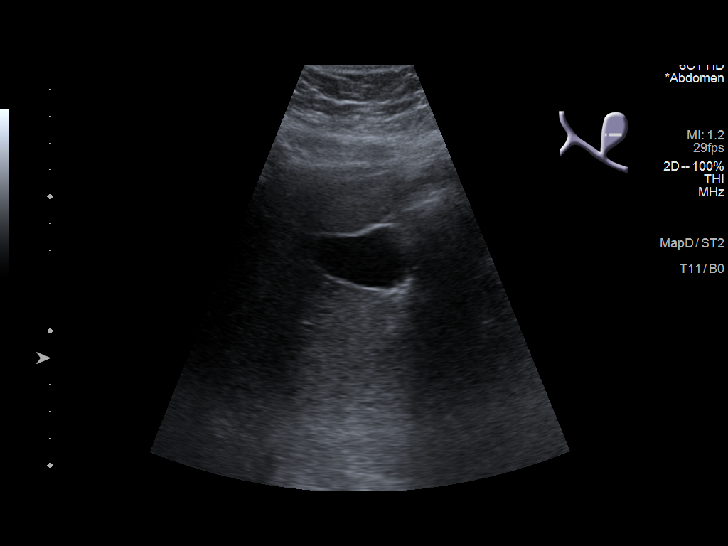
[im 10/60]
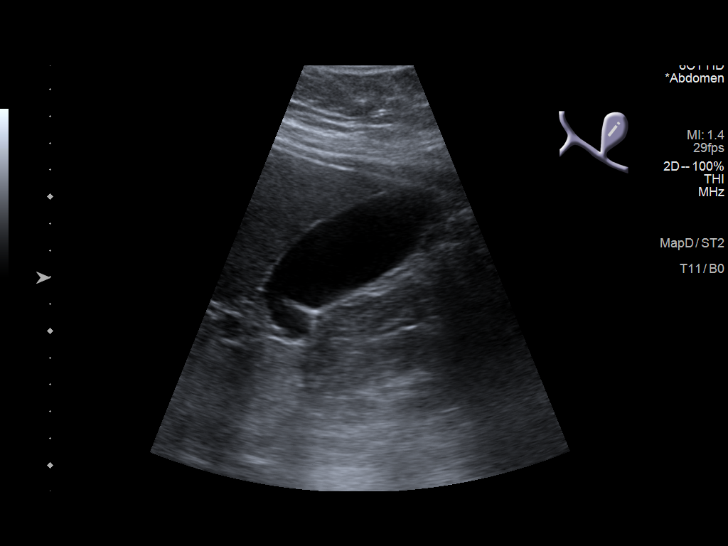
[im 15/60]
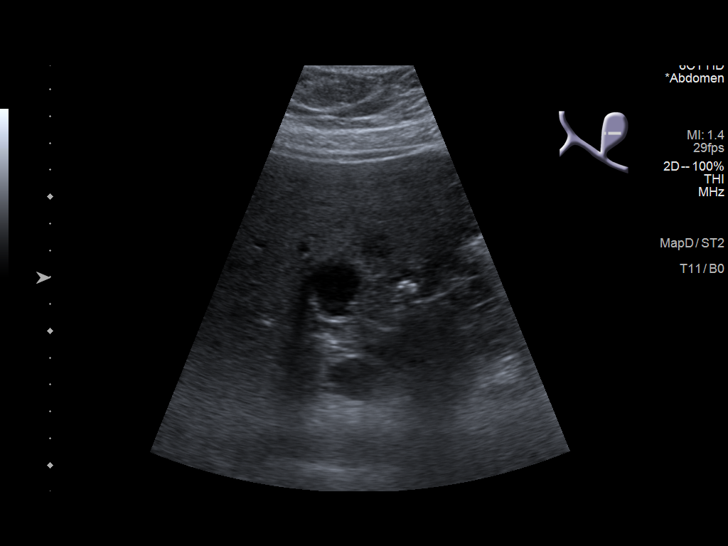
[im 20/60]
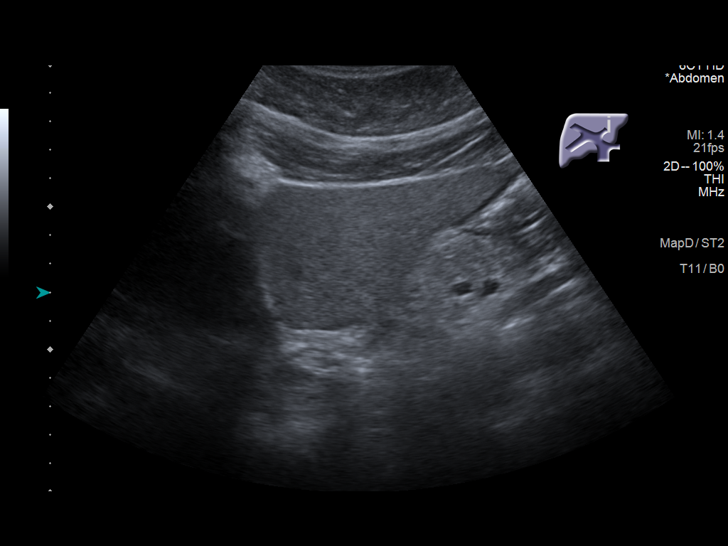
[im 23/60]
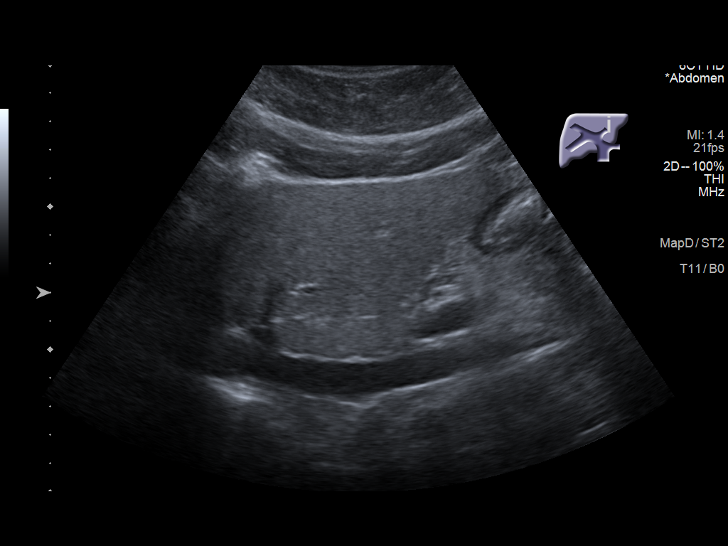
[im 28/60]
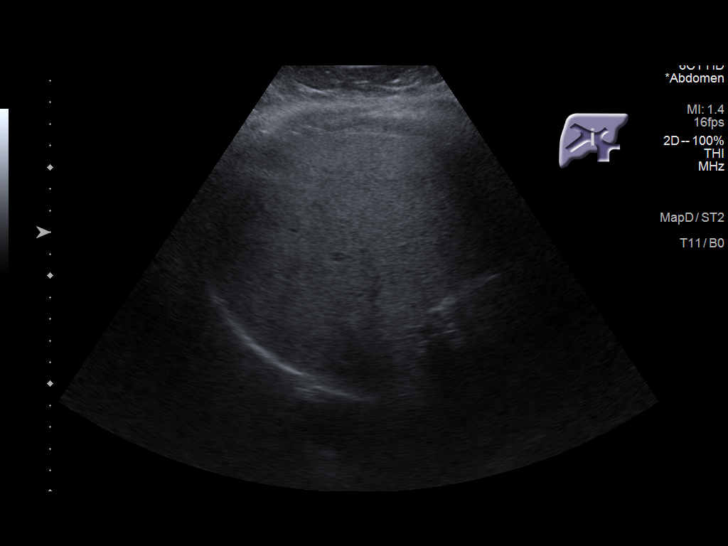
[im 32/60]
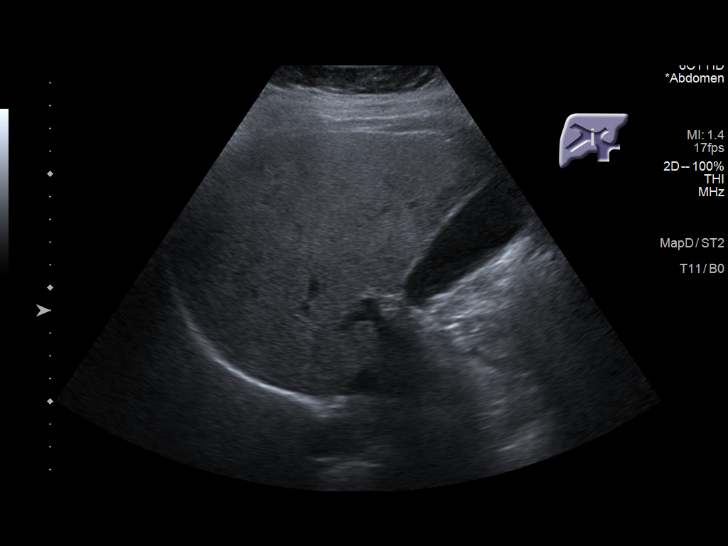
[im 37/60]
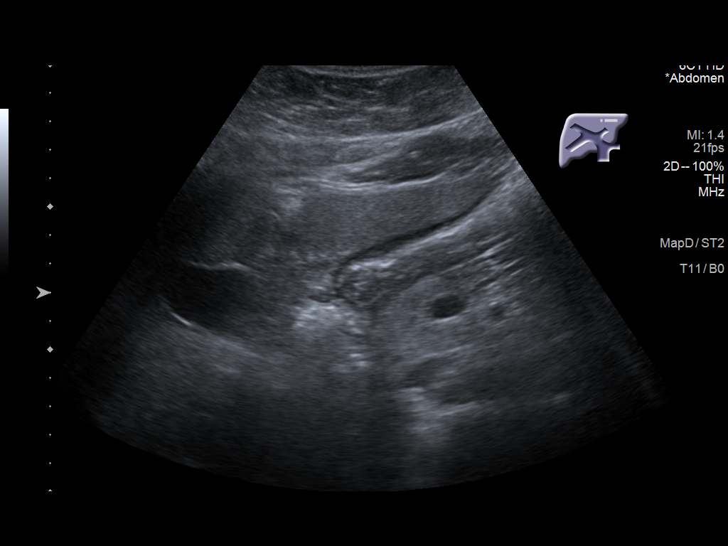
[im 40/60]
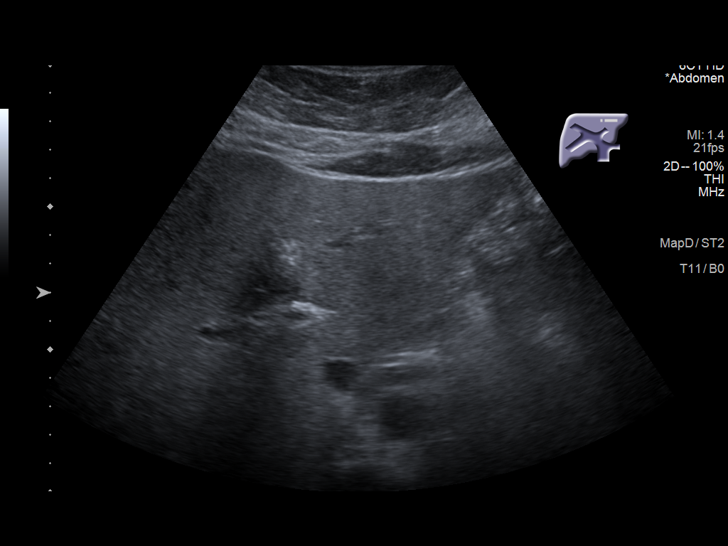
[im 45/60]
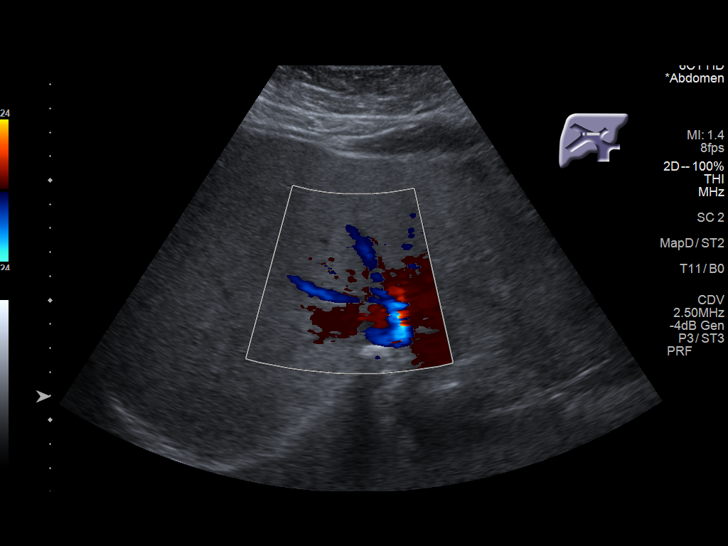
[im 50/60]
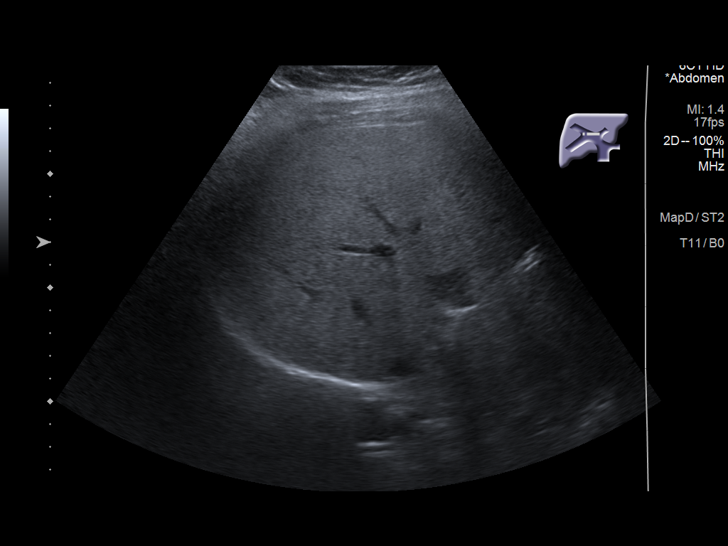
[im 55/60]
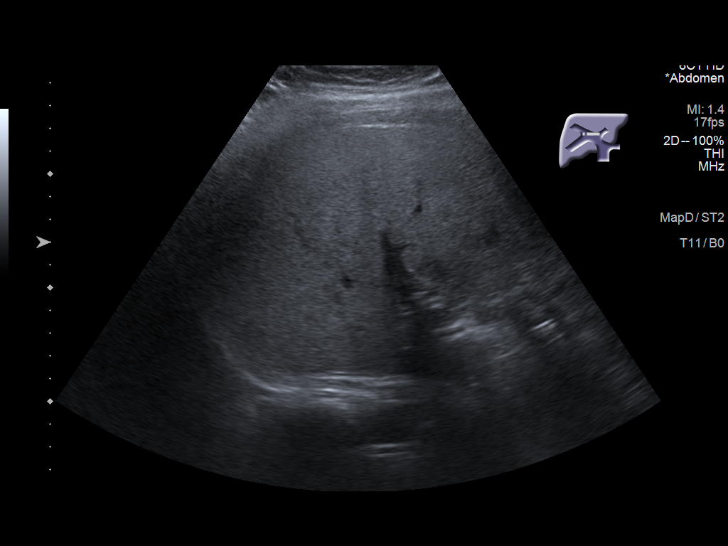
[im 60/60]
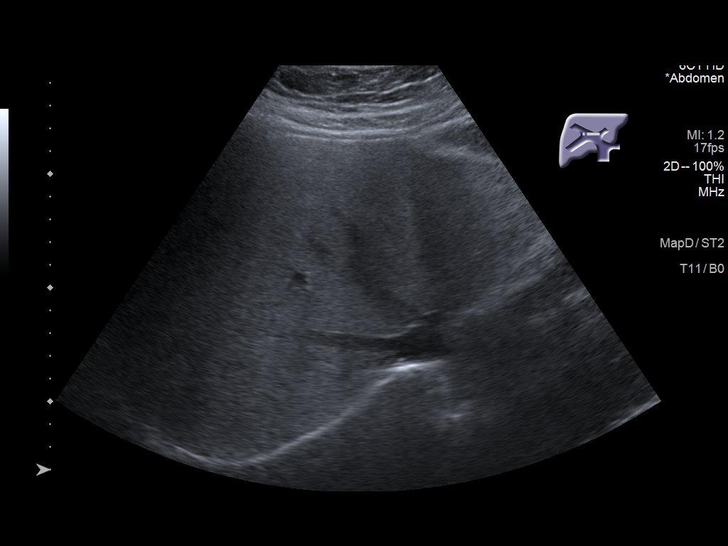

[14 of 25 positions shown; findings below may reference images not displayed]

FINDINGS: Gallbladder:

No gallstones or wall thickening visualized. No sonographic Murphy
sign noted by sonographer. Focus of presumed adenomyomatosis noted
on the prior ultrasound is visualized.

Common bile duct:

Diameter: 2 mm

Liver:

Increased parenchymal echogenicity consistent with fatty
infiltration. Focal areas of decreased echogenicity adjacent to the
gallbladder consistent with focal fatty sparing, similar to the
prior ultrasound. No liver masses or other lesions. Portal vein is
patent on color Doppler imaging with normal direction of blood flow
towards the liver.

Other: None.
IMPRESSION: 1. No acute findings.  No gallstones.  No bile duct dilation.
2. Hepatic steatosis. Focal fatty sparing adjacent to the
gallbladder. This appearance is stable from the prior abdomen
complete ultrasound.

## 2020-12-10 ENCOUNTER — Encounter: Payer: Self-pay | Admitting: General Surgery

## 2021-10-27 DIAGNOSIS — Z1331 Encounter for screening for depression: Secondary | ICD-10-CM | POA: Diagnosis not present

## 2021-10-27 DIAGNOSIS — I10 Essential (primary) hypertension: Secondary | ICD-10-CM | POA: Diagnosis not present

## 2021-10-27 DIAGNOSIS — Z0000 Encounter for general adult medical examination without abnormal findings: Secondary | ICD-10-CM | POA: Diagnosis not present

## 2021-10-27 DIAGNOSIS — Z1389 Encounter for screening for other disorder: Secondary | ICD-10-CM | POA: Diagnosis not present

## 2021-10-27 DIAGNOSIS — E039 Hypothyroidism, unspecified: Secondary | ICD-10-CM | POA: Diagnosis not present

## 2021-10-27 DIAGNOSIS — R600 Localized edema: Secondary | ICD-10-CM | POA: Diagnosis not present

## 2021-10-27 DIAGNOSIS — Z0131 Encounter for examination of blood pressure with abnormal findings: Secondary | ICD-10-CM | POA: Diagnosis not present

## 2021-10-27 DIAGNOSIS — E282 Polycystic ovarian syndrome: Secondary | ICD-10-CM | POA: Diagnosis not present

## 2021-10-27 DIAGNOSIS — Z131 Encounter for screening for diabetes mellitus: Secondary | ICD-10-CM | POA: Diagnosis not present

## 2021-12-23 DIAGNOSIS — Z23 Encounter for immunization: Secondary | ICD-10-CM | POA: Diagnosis not present

## 2022-01-14 DIAGNOSIS — Z0000 Encounter for general adult medical examination without abnormal findings: Secondary | ICD-10-CM | POA: Diagnosis not present

## 2022-01-14 DIAGNOSIS — Z1389 Encounter for screening for other disorder: Secondary | ICD-10-CM | POA: Diagnosis not present

## 2022-01-14 DIAGNOSIS — Z124 Encounter for screening for malignant neoplasm of cervix: Secondary | ICD-10-CM | POA: Diagnosis not present

## 2022-01-14 DIAGNOSIS — Z0131 Encounter for examination of blood pressure with abnormal findings: Secondary | ICD-10-CM | POA: Diagnosis not present

## 2022-01-14 DIAGNOSIS — R69 Illness, unspecified: Secondary | ICD-10-CM | POA: Diagnosis not present

## 2022-01-14 DIAGNOSIS — I10 Essential (primary) hypertension: Secondary | ICD-10-CM | POA: Diagnosis not present

## 2022-03-02 ENCOUNTER — Other Ambulatory Visit: Payer: Self-pay

## 2022-03-02 ENCOUNTER — Emergency Department
Admission: EM | Admit: 2022-03-02 | Discharge: 2022-03-02 | Disposition: A | Discharge status: Home / Self Care | Payer: No Typology Code available for payment source | Attending: Emergency Medicine | Admitting: Emergency Medicine

## 2022-03-02 ENCOUNTER — Encounter: Payer: Self-pay | Admitting: *Deleted

## 2022-03-02 DIAGNOSIS — X58XXXA Exposure to other specified factors, initial encounter: Secondary | ICD-10-CM | POA: Diagnosis not present

## 2022-03-02 DIAGNOSIS — S0501XA Injury of conjunctiva and corneal abrasion without foreign body, right eye, initial encounter: Secondary | ICD-10-CM | POA: Diagnosis present

## 2022-03-02 MED ORDER — TETRACAINE HCL 0.5 % OP SOLN
2.0000 [drp] | Freq: Once | OPHTHALMIC | Status: AC
  Administered 2022-03-02: 2 [drp] via OPHTHALMIC
  Filled 2022-03-02: qty 4

## 2022-03-02 MED ORDER — POLYMYXIN B-TRIMETHOPRIM 10000-0.1 UNIT/ML-% OP SOLN: 2.0000 [drp] | Freq: Three times a day (TID) | OPHTHALMIC | 0 refills | Status: AC

## 2022-03-02 MED ORDER — FLUORESCEIN SODIUM 1 MG OP STRP
1.0000 | ORAL_STRIP | Freq: Once | OPHTHALMIC | Status: AC
  Administered 2022-03-02: 1 via OPHTHALMIC
  Filled 2022-03-02: qty 1

## 2022-03-02 MED ORDER — KETOROLAC TROMETHAMINE 0.5 % OP SOLN: 1.0000 [drp] | Freq: Four times a day (QID) | OPHTHALMIC | 0 refills | Status: AC

## 2022-03-02 NOTE — ED Notes (Signed)
Visual Acuity bilateral 20/50 with glasses that she wears daily

## 2022-03-02 NOTE — ED Provider Notes (Signed)
[facility] Provider Note  Patient Contact: 9:22 PM (approximate)   History   Eye Pain   HPI  Elizabeth Potts is a 41 y.o. female who presents the emergency department complaining of possible foreign body in the right eye.  Patient states that she was at work when she was something flew into her eye.  Unsure what.  She does wear glasses but does not wear contacts.  Has a foreign body sensation to the right eye but no visual changes.  No other injury or complaint at this time.     Physical Exam   Triage Vital Signs: ED Triage Vitals  Enc Vitals Group     BP 03/02/22 1921 (!) 183/121     Pulse Rate 03/02/22 1921 86     Resp 03/02/22 1921 18     Temp 03/02/22 1921 98.2 F (36.8 C)     Temp Source 03/02/22 1921 Oral     SpO2 03/02/22 1921 99 %     Weight 03/02/22 1919 197 lb (89.4 kg)     Height 03/02/22 1919 5\' 1"  (1.549 m)     Head Circumference --      Peak Flow --      Pain Score 03/02/22 1919 3     Pain Loc --      Pain Edu? --      Excl. in GC? --     Most recent vital signs: Vitals:   03/02/22 1921  BP: (!) 183/121  Pulse: 86  Resp: 18  Temp: 98.2 F (36.8 C)  SpO2: 99%     General: Alert and in no acute distress. Eyes:  PERRL. EOMI. visualization of the eye reveals no visible signs of trauma.  No erythema, edema or abnormal findings to the upper or lower eyelid.  Visualization with ophthalmoscope reveals no acute findings to include hyphema, vasculature optic disc abnormality.  No evidence of retained foreign body to the eye.  Eye is anesthetized using tetracaine.  Fluorescein staining applied with small area of uptake between the 4 and 5 o'clock position over the sclera.  Patient has no evidence of retained foreign body.  No other areas of uptake  Cardiovascular:  Good peripheral perfusion Respiratory: Normal respiratory effort without tachypnea or retractions. Lungs CTAB.  Musculoskeletal: Full range of motion to all extremities.   Neurologic:  No gross focal neurologic deficits are appreciated.  Skin:   No rash noted Other:   ED Results / Procedures / Treatments   Labs (all labs ordered are listed, but only abnormal results are displayed) Labs Reviewed - No data to display   EKG     RADIOLOGY    No results found.  PROCEDURES:  Critical Care performed: No  Procedures   MEDICATIONS ORDERED IN ED: Medications  fluorescein ophthalmic strip 1 strip (1 strip Right Eye Given by Other 03/02/22 2128)  tetracaine (PONTOCAINE) 0.5 % ophthalmic solution 2 drop (2 drops Right Eye Given by Other 03/02/22 2129)     IMPRESSION / MDM / ASSESSMENT AND PLAN / ED COURSE  I reviewed the triage vital signs and the nursing notes.                              Differential diagnosis includes, but is not limited to, corneal abrasion, retained foreign body, feared complaint without diagnosis, conjunctivitis  Patient's presentation is most consistent with acute presentation with potential threat to life or bodily  function.   Patient's diagnosis is consistent with corneal abrasion.  Patient presents the emergency department with foreign body sensation in the right eye.  Patient states that a an unknown item struck her right eye while she was at work.  She does wear glasses but denies any vision changes.  Funduscopic exam is reassuring.  Patient has a small area of uptake consistent with corneal abrasion on physical exam.  Patiently placed on antibiotic eyedrops and Acular for symptom relief.  Concerning signs and symptoms discussed with the patient.  Patient has any ongoing symptoms follow-up with ophthalmology..  Patient is given ED precautions to return to the ED for any worsening or new symptoms.        FINAL CLINICAL IMPRESSION(S) / ED DIAGNOSES   Final diagnoses:  Abrasion of right cornea, initial encounter     Rx / DC Orders   ED Discharge Orders          Ordered    ketorolac (ACULAR) 0.5 %  ophthalmic solution  4 times daily        03/02/22 2139    trimethoprim-polymyxin b (POLYTRIM) ophthalmic solution  Every 8 hours        03/02/22 2139             Note:  This document was prepared using Dragon voice recognition software and may include unintentional dictation errors.   Lanette Hampshire 03/02/22 2139    Shaune Pollack, MD 03/02/22 [phone]

## 2022-03-02 NOTE — ED Triage Notes (Signed)
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

## 2022-11-23 DIAGNOSIS — I10 Essential (primary) hypertension: Secondary | ICD-10-CM | POA: Diagnosis not present

## 2022-11-23 DIAGNOSIS — Z0131 Encounter for examination of blood pressure with abnormal findings: Secondary | ICD-10-CM | POA: Diagnosis not present

## 2022-11-23 DIAGNOSIS — Z1389 Encounter for screening for other disorder: Secondary | ICD-10-CM | POA: Diagnosis not present

## 2022-11-23 DIAGNOSIS — H60502 Unspecified acute noninfective otitis externa, left ear: Secondary | ICD-10-CM | POA: Diagnosis not present

## 2022-11-23 DIAGNOSIS — Z1331 Encounter for screening for depression: Secondary | ICD-10-CM | POA: Diagnosis not present

## 2023-12-02 ENCOUNTER — Other Ambulatory Visit: Payer: Self-pay | Admitting: Nurse Practitioner

## 2023-12-02 DIAGNOSIS — Z1231 Encounter for screening mammogram for malignant neoplasm of breast: Secondary | ICD-10-CM
